# Patient Record
Sex: Female | Born: 1950 | Race: Black or African American | Hispanic: No | Marital: Single | Smoking: Never smoker
Health system: Southern US, Community
[De-identification: ages and names within clinical notes are randomized; demographics above are authoritative.]

---

## 2018-05-19 ENCOUNTER — Encounter (HOSPITAL_BASED_OUTPATIENT_CLINIC_OR_DEPARTMENT_OTHER): Payer: Self-pay | Admitting: Emergency Medicine

## 2018-05-19 ENCOUNTER — Other Ambulatory Visit: Payer: Self-pay

## 2018-05-19 ENCOUNTER — Emergency Department (HOSPITAL_BASED_OUTPATIENT_CLINIC_OR_DEPARTMENT_OTHER): Payer: BC Managed Care – PPO

## 2018-05-19 ENCOUNTER — Emergency Department (HOSPITAL_BASED_OUTPATIENT_CLINIC_OR_DEPARTMENT_OTHER)
Admission: EM | Admit: 2018-05-19 | Discharge: 2018-05-19 | Disposition: A | Discharge status: Home / Self Care | Payer: BC Managed Care – PPO | Attending: Emergency Medicine | Admitting: Emergency Medicine

## 2018-05-19 DIAGNOSIS — R112 Nausea with vomiting, unspecified: Secondary | ICD-10-CM | POA: Insufficient documentation

## 2018-05-19 DIAGNOSIS — R1084 Generalized abdominal pain: Secondary | ICD-10-CM | POA: Insufficient documentation

## 2018-05-19 DIAGNOSIS — R1110 Vomiting, unspecified: Secondary | ICD-10-CM | POA: Diagnosis present

## 2018-05-19 LAB — CBC WITH DIFFERENTIAL/PLATELET
Basophils Absolute: 0 10*3/uL (ref 0.0–0.1)
Basophils Relative: 0 %
EOS PCT: 0 %
Eosinophils Absolute: 0 10*3/uL (ref 0.0–0.7)
HEMATOCRIT: 38.6 % (ref 36.0–46.0)
Hemoglobin: 13.6 g/dL (ref 12.0–15.0)
LYMPHS ABS: 1.4 10*3/uL (ref 0.7–4.0)
LYMPHS PCT: 18 %
MCH: 32.9 pg (ref 26.0–34.0)
MCHC: 35.2 g/dL (ref 30.0–36.0)
MCV: 93.5 fL (ref 78.0–100.0)
MONO ABS: 0.3 10*3/uL (ref 0.1–1.0)
Monocytes Relative: 4 %
Neutro Abs: 6 10*3/uL (ref 1.7–7.7)
Neutrophils Relative %: 78 %
PLATELETS: 184 10*3/uL (ref 150–400)
RBC: 4.13 MIL/uL (ref 3.87–5.11)
RDW: 13.8 % (ref 11.5–15.5)
WBC: 7.8 10*3/uL (ref 4.0–10.5)

## 2018-05-19 LAB — COMPREHENSIVE METABOLIC PANEL
ALT: 15 U/L (ref 0–44)
AST: 27 U/L (ref 15–41)
Albumin: 4.1 g/dL (ref 3.5–5.0)
Alkaline Phosphatase: 87 U/L (ref 38–126)
Anion gap: 9 (ref 5–15)
BILIRUBIN TOTAL: 0.7 mg/dL (ref 0.3–1.2)
BUN: 10 mg/dL (ref 8–23)
CHLORIDE: 106 mmol/L (ref 98–111)
CO2: 25 mmol/L (ref 22–32)
CREATININE: 0.58 mg/dL (ref 0.44–1.00)
Calcium: 8.8 mg/dL — ABNORMAL LOW (ref 8.9–10.3)
GFR calc Af Amer: >60 mL/min (ref >60)
Glucose, Bld: 117 mg/dL — ABNORMAL HIGH (ref 70–99)
Potassium: 3.4 mmol/L — ABNORMAL LOW (ref 3.5–5.1)
Sodium: 140 mmol/L (ref 135–145)
TOTAL PROTEIN: 7.1 g/dL (ref 6.5–8.1)

## 2018-05-19 LAB — LIPASE, BLOOD: Lipase: 33 U/L (ref 11–51)

## 2018-05-19 LAB — TROPONIN I: Troponin I: <0.03 ng/mL (ref <0.03)

## 2018-05-19 MED ORDER — ONDANSETRON HCL 4 MG/2ML IJ SOLN
4.0000 mg | Freq: Once | INTRAMUSCULAR | Status: AC
  Administered 2018-05-19: 4 mg via INTRAVENOUS
  Filled 2018-05-19: qty 2

## 2018-05-19 MED ORDER — GI COCKTAIL ~~LOC~~
30.0000 mL | Freq: Once | ORAL | Status: AC
  Administered 2018-05-19: 30 mL via ORAL
  Filled 2018-05-19: qty 30

## 2018-05-19 MED ORDER — SODIUM CHLORIDE 0.9 % IV BOLUS
1000.0000 mL | Freq: Once | INTRAVENOUS | Status: AC
  Administered 2018-05-19: 1000 mL via INTRAVENOUS

## 2018-05-19 MED ORDER — ONDANSETRON HCL 4 MG PO TABS: 4.0000 mg | ORAL_TABLET | Freq: Three times a day (TID) | ORAL | 0 refills | Status: AC | PRN

## 2018-05-19 MED ORDER — IOPAMIDOL (ISOVUE-300) INJECTION 61%
100.0000 mL | Freq: Once | INTRAVENOUS | Status: AC | PRN
  Administered 2018-05-19: 100 mL via INTRAVENOUS

## 2018-05-19 MED ORDER — MORPHINE SULFATE (PF) 4 MG/ML IV SOLN
4.0000 mg | Freq: Once | INTRAVENOUS | Status: AC
  Administered 2018-05-19: 4 mg via INTRAVENOUS
  Filled 2018-05-19: qty 1

## 2018-05-19 NOTE — ED Provider Notes (Signed)
3:30 PM Care assumed from Dr. Adela LankFloyd. At time of sign out, pt awaiting results of CT scan and for reassessment after fluids and symptomatic medications. Previous team felt symptoms are likely from breakfast.   4:44 PM CT result was reassuring aside from small hiatal hernia and left renal cyst.  Patient was informed of these findings.  No evidence of diverticulitis, obstruction, liver or gallbladder abnormality or other significant pathology causing her symptoms.  Patient was feeling much better after the fluids nausea medicine and pain medicine.  Suspect gastroenteritis versus food exposure causing symptoms.  Patient agreed with plan for discharge home with nausea medicine and PCP follow-up when she returns home.  Patient understood return precautions and was discharged in good condition.   Clinical Impression: 1. Generalized abdominal pain   2. Non-intractable vomiting with nausea, unspecified vomiting type     Disposition: Discharge  Condition: Good  I have discussed the results, Dx and Tx plan with the pt(& family if present). He/she/they expressed understanding and agree(s) with the plan. Discharge instructions discussed at great length. Strict return precautions discussed and pt &/or family have verbalized understanding of the instructions. No further questions at time of discharge.    New Prescriptions   ONDANSETRON (ZOFRAN) 4 MG TABLET    Take 1 tablet (4 mg total) by mouth every 8 (eight) hours as needed.    [facility] [address] [id number] mc [address] [id number]       Harlen Danford, Canary Brimhristopher J, MD 05/19/18 [phone]

## 2018-05-19 NOTE — Discharge Instructions (Signed)
Your work-up and imaging was overall reassuring.  We suspect you either have gastroenteritis or nausea and vomiting from food that she wait.  Please stay hydrated and use the nausea medicine to help maintain hydration.  Please follow-up with your primary care physician when you return home.  If any symptoms change or worsen and you cannot stay hydrated, please return to the nearest emergency department.

## 2018-05-19 NOTE — ED Notes (Signed)
Per EMS pt from out of town at [employer] convention, c/o n/v; nothing done in route

## 2018-05-19 NOTE — ED Provider Notes (Signed)
[facility] Provider Note   CSN: [id number] Arrival date & time: 05/19/18  1314     History   Chief Complaint Chief Complaint  Patient presents with  . Emesis    HPI Mary Durham is a 67 y.o. female.  67 yo F with a chief complaint of nausea and vomiting.  This been going on since this morning.  She ate breakfast and then it started.  She has had some subjective fevers and chills.  Denies diarrhea.  Describes a burning epigastric pain.  Nothing seems to make this better or worse.  Denies radiation of the pain.  Denies sick contacts.  Patient denies any prior medical history denies any home medications.  The history is provided by the patient.  Emesis   This is a new problem. The current episode started 6 to 12 hours ago. The problem occurs continuously. The problem has not changed since onset.The emesis has an appearance of stomach contents. There has been no fever (subjective). The fever has been present for less than 1 day. Associated symptoms include abdominal pain. Pertinent negatives include no arthralgias, no chills, no fever, no headaches and no myalgias.    History reviewed. No pertinent past medical history.  There are no active problems to display for this patient.   History reviewed. No pertinent surgical history.   OB History   None      Home Medications    Prior to Admission medications   Medication Sig Start Date End Date Taking? Authorizing Provider  ondansetron (ZOFRAN) 4 MG tablet Take 1 tablet (4 mg total) by mouth every 8 (eight) hours as needed. 05/19/18   Tegeler, Canary Brim, MD    Family History History reviewed. No pertinent family history.  Social History Social History   Tobacco Use  . Smoking status: Never Smoker  . Smokeless tobacco: Never Used  Substance Use Topics  . Alcohol use: Never    Frequency: Never  . Drug use: Never     Allergies   Codeine   Review of Systems Review of Systems    Constitutional: Negative for chills and fever.  HENT: Negative for congestion and rhinorrhea.   Eyes: Negative for redness and visual disturbance.  Respiratory: Negative for shortness of breath and wheezing.   Cardiovascular: Negative for chest pain and palpitations.  Gastrointestinal: Positive for abdominal pain, nausea and vomiting.  Genitourinary: Negative for dysuria and urgency.  Musculoskeletal: Negative for arthralgias and myalgias.  Skin: Negative for pallor and wound.  Neurological: Negative for dizziness and headaches.     Physical Exam Updated Vital Signs BP 138/74 (BP Location: Left Arm)   Pulse (!) 53   Temp 97.8 F (36.6 C) (Oral)   Resp 18   SpO2 99%   Physical Exam  Constitutional: She is oriented to person, place, and time. She appears well-developed and well-nourished. No distress.  HENT:  Head: Normocephalic and atraumatic.  Eyes: Pupils are equal, round, and reactive to light. EOM are normal.  Neck: Normal range of motion. Neck supple.  Cardiovascular: Normal rate and regular rhythm. Exam reveals no gallop and no friction rub.  No murmur heard. Pulmonary/Chest: Effort normal. She has no wheezes. She has no rales.  Abdominal: Soft. She exhibits no distension and no mass. There is tenderness (worst to the epigastrium, mild RUQ, negative murphys). There is no guarding.  Musculoskeletal: She exhibits no edema or tenderness.  Neurological: She is alert and oriented to person, place, and time.  Skin: Skin  is warm and dry. She is not diaphoretic.  Psychiatric: She has a normal mood and affect. Her behavior is normal.  Nursing note and vitals reviewed.    ED Treatments / Results  Labs (all labs ordered are listed, but only abnormal results are displayed) Labs Reviewed  COMPREHENSIVE METABOLIC PANEL - Abnormal; Notable for the following components:      Result Value   Potassium 3.4 (*)    Glucose, Bld 117 (*)    Calcium 8.8 (*)    All other components  within normal limits  CBC WITH DIFFERENTIAL/PLATELET  LIPASE, BLOOD  TROPONIN I    EKG EKG Interpretation  Date/Time:  Tuesday May 19 2018 13:37:59 EDT Ventricular Rate:  59 PR Interval:    QRS Duration: 87 QT Interval:  495 QTC Calculation: 491 R Axis:   75 Text Interpretation:  Sinus rhythm Borderline prolonged QT interval No old tracing to compare Confirmed by Melene Plan [phone]) on 05/19/2018 1:40:42 PM   Radiology Ct Abdomen Pelvis W Contrast  Result Date: 05/19/2018 CLINICAL DATA:  Abdominal pain, nausea and vomiting. EXAM: CT ABDOMEN AND PELVIS WITH CONTRAST TECHNIQUE: Multidetector CT imaging of the abdomen and pelvis was performed using the standard protocol following bolus administration of intravenous contrast. CONTRAST:  ISOVUE-300 IOPAMIDOL (ISOVUE-300) INJECTION 61% COMPARISON:  None. FINDINGS: Lower chest: No acute abnormality. Hepatobiliary: No focal liver abnormality is seen. No gallstones, gallbladder wall thickening, or biliary dilatation. Pancreas: Unremarkable. No pancreatic ductal dilatation or surrounding inflammatory changes. Spleen: Normal in size without focal abnormality. Adrenals/Urinary Tract: Adrenal glands are unremarkable. Kidneys are normal, without renal calculi, focal lesion, or hydronephrosis. Cysts of the right kidney have a benign appearance. The largest measures 3.5 cm. Bladder is unremarkable. Stomach/Bowel: Bowel shows no evidence of obstruction or inflammation. There is a small hiatal hernia. The appendix is normal. No free air. No bowel lesions identified. Vascular/Lymphatic: No significant vascular findings are present. No enlarged abdominal or pelvic lymph nodes. Reproductive: Uterus and bilateral adnexa are unremarkable. Other: No abdominal wall hernia or abnormality. No abdominopelvic ascites. Musculoskeletal: No acute or significant osseous findings. IMPRESSION: No acute findings in the abdomen or pelvis. Small hiatal hernia present.  Electronically Signed   By: Irish Lack M.D.   On: 05/19/2018 16:26    Procedures Procedures (including critical care time)  Medications Ordered in ED Medications  sodium chloride 0.9 % bolus 1,000 mL (0 mLs Intravenous Stopped 05/19/18 1454)  ondansetron (ZOFRAN) injection 4 mg (4 mg Intravenous Given 05/19/18 1328)  morphine 4 MG/ML injection 4 mg (4 mg Intravenous Given 05/19/18 1333)  gi cocktail (Maalox,Lidocaine,Donnatal) (30 mLs Oral Given 05/19/18 1511)  ondansetron (ZOFRAN) injection 4 mg (4 mg Intravenous Given 05/19/18 1510)  iopamidol (ISOVUE-300) 61 % injection 100 mL (100 mLs Intravenous Contrast Given 05/19/18 1559)     Initial Impression / Assessment and Plan / ED Course  I have reviewed the triage vital signs and the nursing notes.  Pertinent labs & imaging results that were available during my care of the patient were reviewed by me and considered in my medical decision making (see chart for details).     67 yo F with a chief complaint of nausea and vomiting.  Going on for about 12 hours or so.  On my exam patient with some epigastric tenderness.  Will obtain labs give pain medicine and nausea medicine and IV fluids and reassess.  Likely GI illness based on hx. Screening trop and ecg negative.  No change in pain  feel single trop sufficient.  Symptoms significantly improved here but still with continued pain, will ct.  Signout given to Dr. Rush Landmarkegeler.  Please see his note for further details of care.    The patients results and plan were reviewed and discussed.   Any x-rays performed were independently reviewed by myself.   Differential diagnosis were considered with the presenting HPI.  Medications  sodium chloride 0.9 % bolus 1,000 mL (0 mLs Intravenous Stopped 05/19/18 1454)  ondansetron (ZOFRAN) injection 4 mg (4 mg Intravenous Given 05/19/18 1328)  morphine 4 MG/ML injection 4 mg (4 mg Intravenous Given 05/19/18 1333)  gi cocktail (Maalox,Lidocaine,Donnatal) (30 mLs  Oral Given 05/19/18 1511)  ondansetron (ZOFRAN) injection 4 mg (4 mg Intravenous Given 05/19/18 1510)  iopamidol (ISOVUE-300) 61 % injection 100 mL (100 mLs Intravenous Contrast Given 05/19/18 1559)    Vitals:   05/19/18 1316 05/19/18 1400 05/19/18 1530 05/19/18 1645  BP: 136/72 (!) 156/74 136/67 138/74  Pulse: 64 (!) 57  (!) 53  Resp: 16 13 14 18   Temp: 97.8 F (36.6 C)     TempSrc: Oral     SpO2: 99% 100% 98% 99%    Final diagnoses:  Generalized abdominal pain  Non-intractable vomiting with nausea, unspecified vomiting type       Final Clinical Impressions(s) / ED Diagnoses   Final diagnoses:  Generalized abdominal pain  Non-intractable vomiting with nausea, unspecified vomiting type    ED Discharge Orders        Ordered    ondansetron (ZOFRAN) 4 MG tablet  Every 8 hours PRN     05/19/18 1644       Melene PlanFloyd, Lem Peary, DO 05/20/18 [phone]

## 2018-05-19 NOTE — ED Notes (Signed)
ED Provider at bedside. 

## 2018-05-19 NOTE — ED Triage Notes (Signed)
Patient reports mid abdominal pain with nausea and vomiting since 0900 today.  States she believes she ate something bad.

## 2019-11-09 IMAGING — CT CT ABD-PELV W/ CM
2 of 5 series · 16 of 46 positions shown, 18 images · IV contrast (APPLIED)
Comparison: None.

CLINICAL DATA: Abdominal pain, nausea and vomiting.

EXAM:
CT ABDOMEN AND PELVIS WITH CONTRAST
TECHNIQUE: Multidetector CT imaging of the abdomen and pelvis was performed
using the standard protocol following bolus administration of
intravenous contrast.
CONTRAST:  100mL 2DXHZR-0RR IOPAMIDOL (2DXHZR-0RR) INJECTION 61%

[Series 2: axial st · axial · 0.95mm/px · z∈[-477,-72]mm · 13 of 91 slices shown, 15 images]
[im 5/91  soft-tissue]
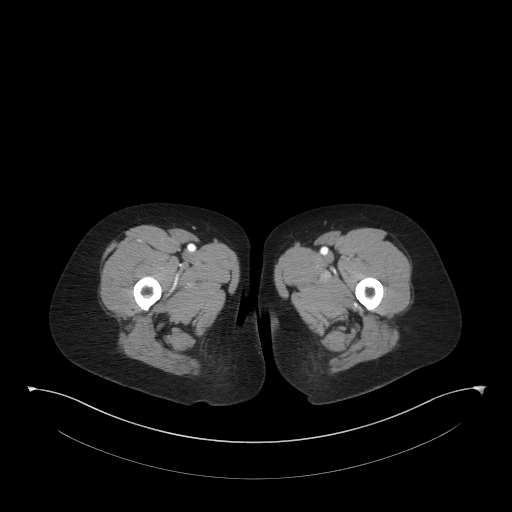
[im 5/91  bone]
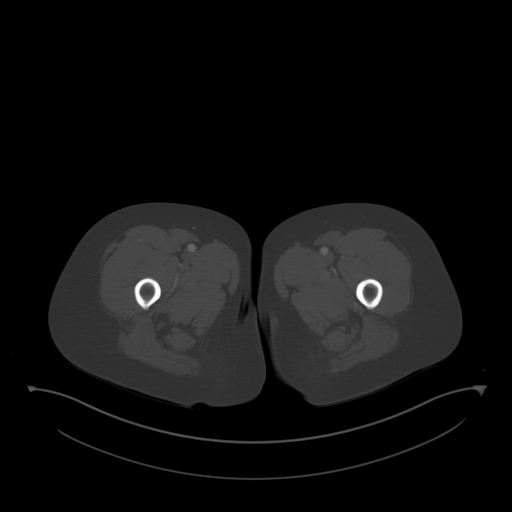
[im 14/91  soft-tissue]
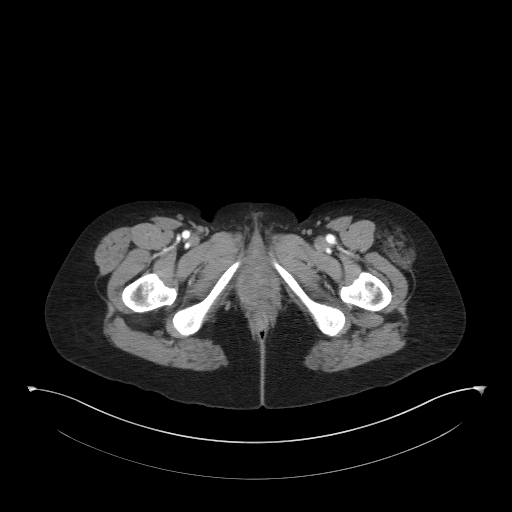
[im 19/91  soft-tissue]
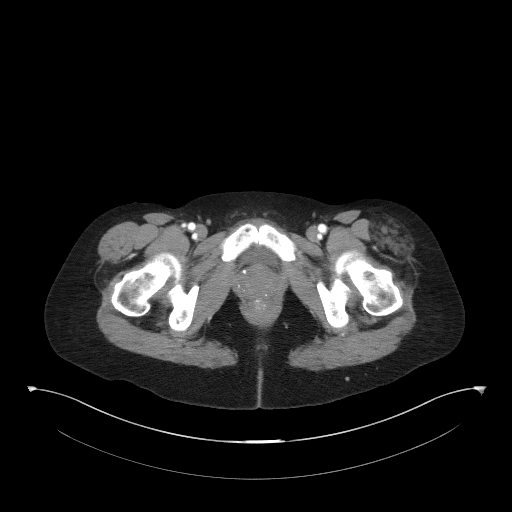
[im 28/91  soft-tissue]
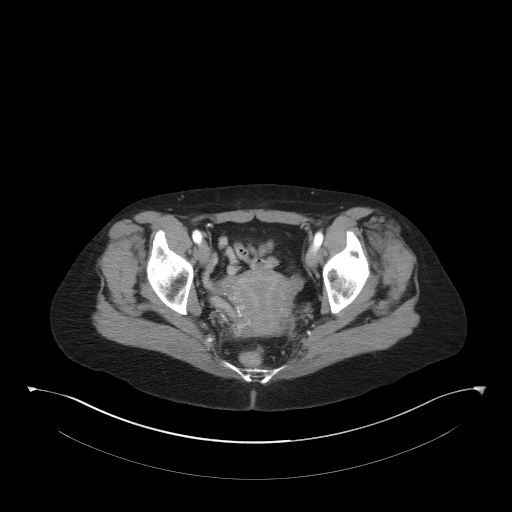
[im 32/91  soft-tissue]
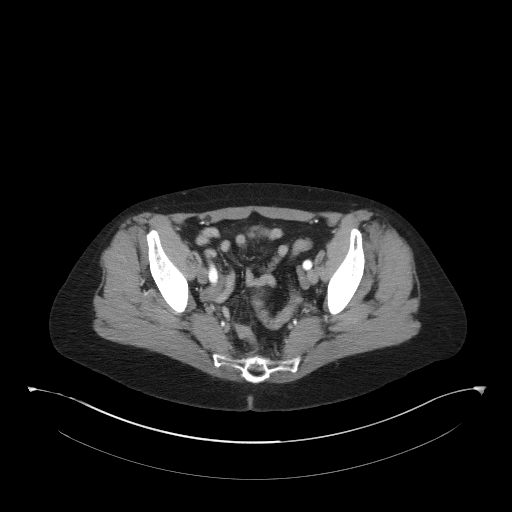
[im 41/91  soft-tissue]
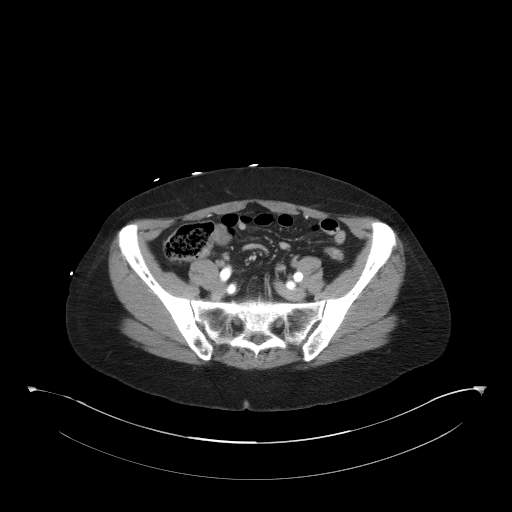
[im 46/91  soft-tissue]
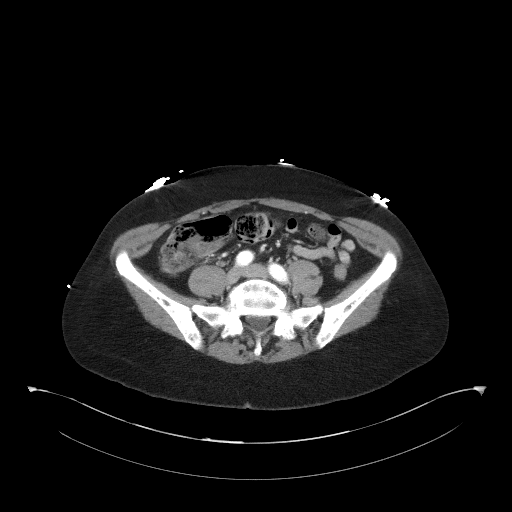
[im 50/91  soft-tissue]
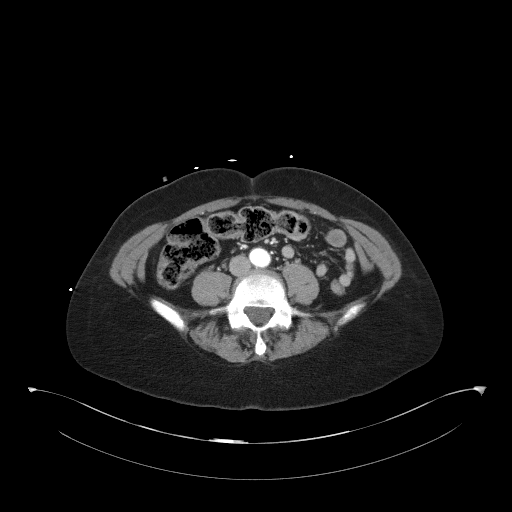
[im 59/91  soft-tissue]
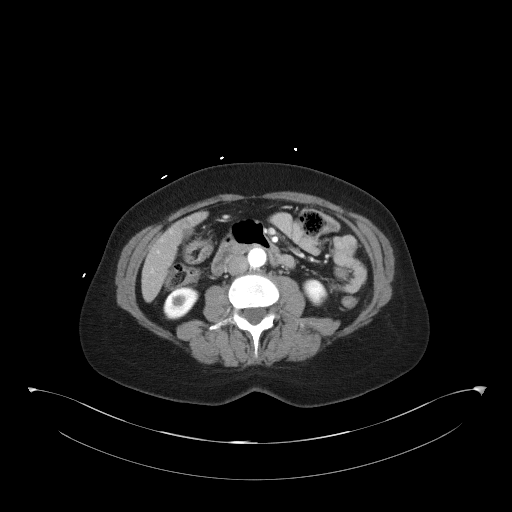
[im 59/91  bone]
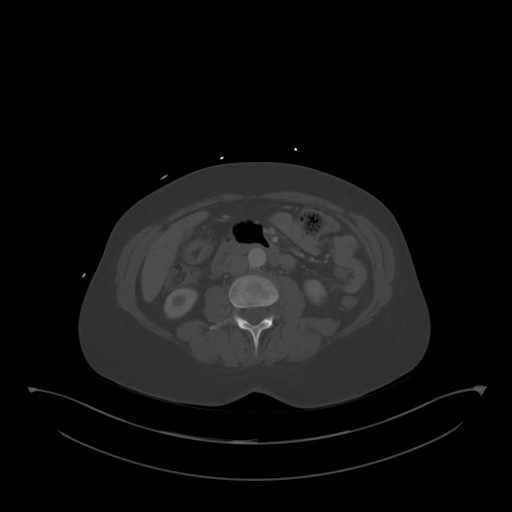
[im 64/91  soft-tissue]
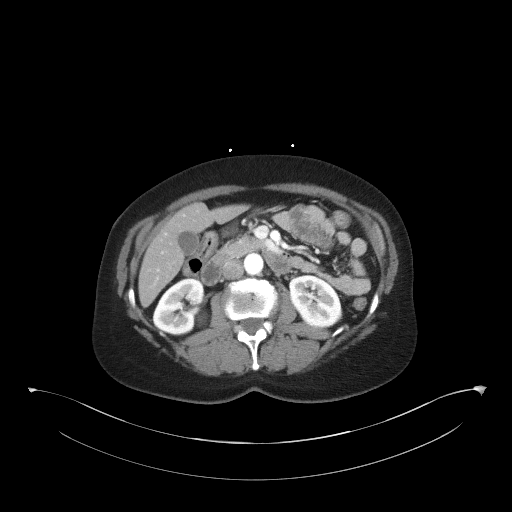
[im 73/91  soft-tissue]
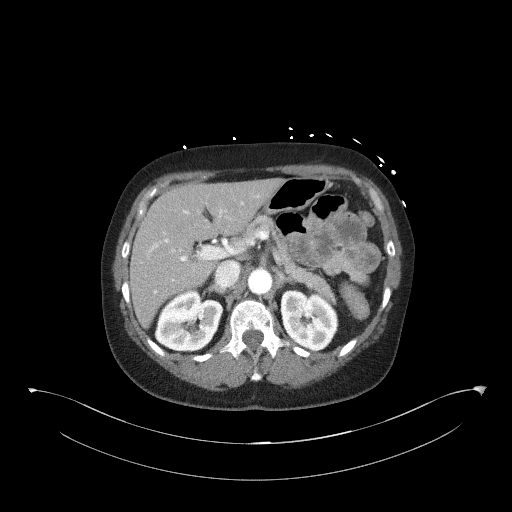
[im 77/91  soft-tissue]
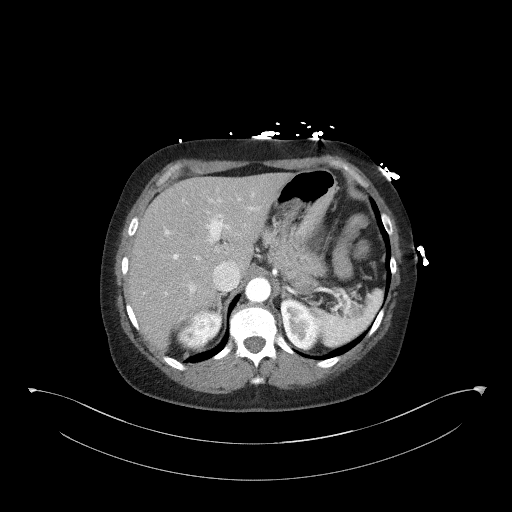
[im 86/91  soft-tissue]
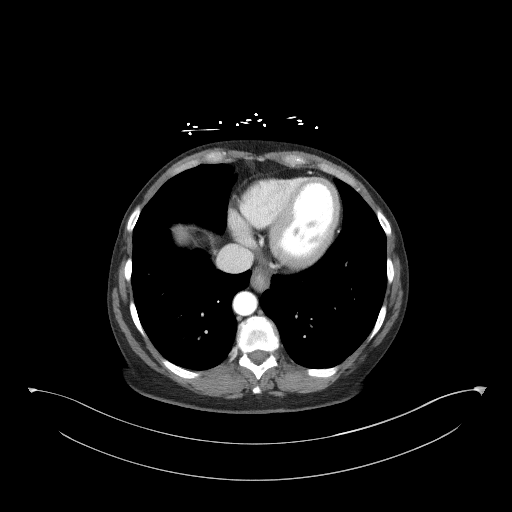

[Series 5: coronal st · coronal · 0.74mm/px · 3 of 88 slices shown]
[im 30/88  soft-tissue]
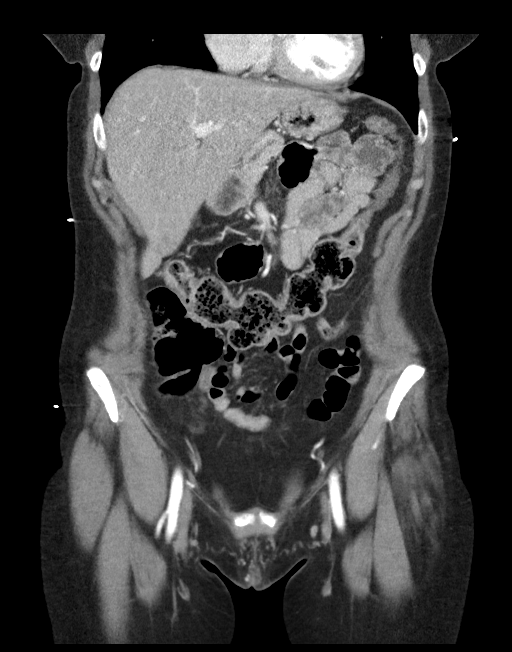
[im 39/88  soft-tissue]
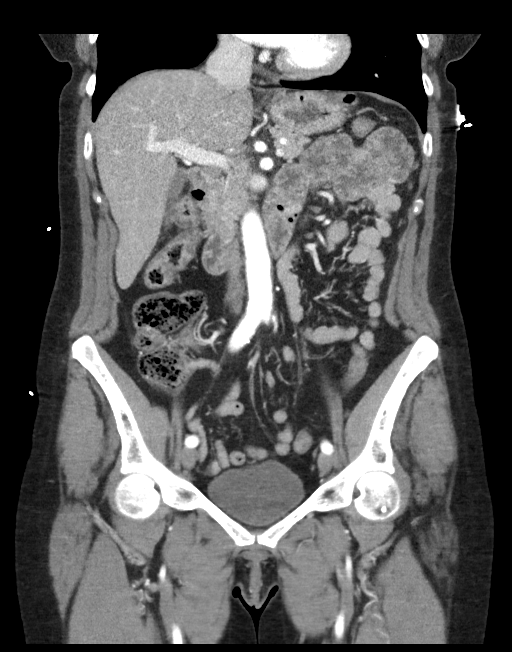
[im 49/88  soft-tissue]
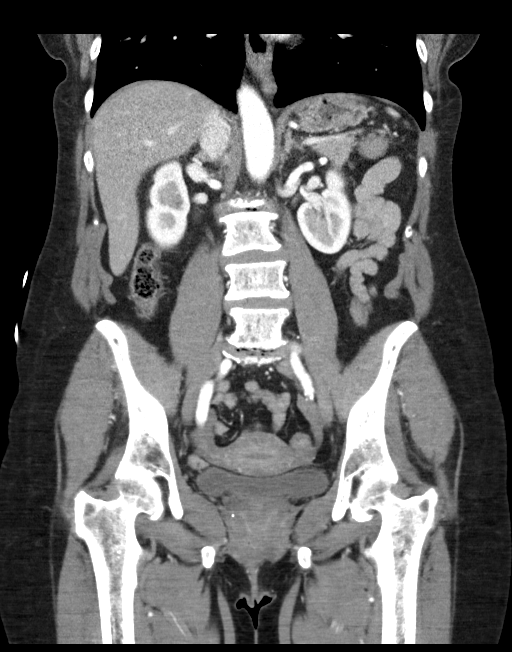

[16 of 46 positions shown; findings below may reference images not displayed]

FINDINGS: Lower chest: No acute abnormality.

Hepatobiliary: No focal liver abnormality is seen. No gallstones,
gallbladder wall thickening, or biliary dilatation.

Pancreas: Unremarkable. No pancreatic ductal dilatation or
surrounding inflammatory changes.

Spleen: Normal in size without focal abnormality.

Adrenals/Urinary Tract: Adrenal glands are unremarkable. Kidneys are
normal, without renal calculi, focal lesion, or hydronephrosis.
Cysts of the right kidney have a benign appearance. The largest
measures 3.5 cm. Bladder is unremarkable.

Stomach/Bowel: Bowel shows no evidence of obstruction or
inflammation. There is a small hiatal hernia. The appendix is
normal. No free air. No bowel lesions identified.

Vascular/Lymphatic: No significant vascular findings are present. No
enlarged abdominal or pelvic lymph nodes.

Reproductive: Uterus and bilateral adnexa are unremarkable.

Other: No abdominal wall hernia or abnormality. No abdominopelvic
ascites.

Musculoskeletal: No acute or significant osseous findings.
IMPRESSION: No acute findings in the abdomen or pelvis. Small hiatal hernia
present.
